# Patient Record
Sex: Female | Born: 2005 | Hispanic: Yes | Marital: Single | Smoking: Never smoker
Health system: Southern US, Community
[De-identification: ages and names within clinical notes are randomized; demographics above are authoritative.]

## PROBLEM LIST (undated history)

## (undated) DIAGNOSIS — E282 Polycystic ovarian syndrome: Secondary | ICD-10-CM

---
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
## 2024-03-28 ENCOUNTER — Other Ambulatory Visit: Payer: Self-pay

## 2024-03-28 ENCOUNTER — Emergency Department (HOSPITAL_COMMUNITY)

## 2024-03-28 ENCOUNTER — Encounter (HOSPITAL_COMMUNITY): Payer: Self-pay

## 2024-03-28 ENCOUNTER — Emergency Department (HOSPITAL_COMMUNITY)
Admission: EM | Admit: 2024-03-28 | Discharge: 2024-03-28 | Disposition: A | Discharge status: Home / Self Care | Attending: Emergency Medicine | Admitting: Emergency Medicine

## 2024-03-28 DIAGNOSIS — N946 Dysmenorrhea, unspecified: Secondary | ICD-10-CM | POA: Diagnosis not present

## 2024-03-28 DIAGNOSIS — R55 Syncope and collapse: Secondary | ICD-10-CM

## 2024-03-28 DIAGNOSIS — N939 Abnormal uterine and vaginal bleeding, unspecified: Secondary | ICD-10-CM | POA: Diagnosis present

## 2024-03-28 HISTORY — DX: Polycystic ovarian syndrome: E28.2

## 2024-03-28 LAB — COMPREHENSIVE METABOLIC PANEL WITH GFR
ALT: 22 U/L (ref 0–44)
AST: 20 U/L (ref 15–41)
Albumin: 3.9 g/dL (ref 3.5–5.0)
Alkaline Phosphatase: 70 U/L (ref 38–126)
Anion gap: 7 (ref 5–15)
BUN: 9 mg/dL (ref 6–20)
CO2: 24 mmol/L (ref 22–32)
Calcium: 9.3 mg/dL (ref 8.9–10.3)
Chloride: 104 mmol/L (ref 98–111)
Creatinine, Ser: 0.46 mg/dL (ref 0.44–1.00)
GFR, Estimated: >60 mL/min (ref >60)
Glucose, Bld: 93 mg/dL (ref 70–99)
Potassium: 3.8 mmol/L (ref 3.5–5.1)
Sodium: 135 mmol/L (ref 135–145)
Total Bilirubin: 0.5 mg/dL (ref 0.0–1.2)
Total Protein: 7.7 g/dL (ref 6.5–8.1)

## 2024-03-28 LAB — TYPE AND SCREEN
ABO/RH(D): O POS
Antibody Screen: NEGATIVE

## 2024-03-28 LAB — CBC WITH DIFFERENTIAL/PLATELET
Abs Immature Granulocytes: 0.03 10*3/uL (ref 0.00–0.07)
Basophils Absolute: 0 10*3/uL (ref 0.0–0.1)
Basophils Relative: 0 %
Eosinophils Absolute: 0.1 10*3/uL (ref 0.0–0.5)
Eosinophils Relative: 1 %
HCT: 41.7 % (ref 36.0–46.0)
Hemoglobin: 13.6 g/dL (ref 12.0–15.0)
Immature Granulocytes: 0 %
Lymphocytes Relative: 21 %
Lymphs Abs: 2.1 10*3/uL (ref 0.7–4.0)
MCH: 28.3 pg (ref 26.0–34.0)
MCHC: 32.6 g/dL (ref 30.0–36.0)
MCV: 86.7 fL (ref 80.0–100.0)
Monocytes Absolute: 0.6 10*3/uL (ref 0.1–1.0)
Monocytes Relative: 6 %
Neutro Abs: 7.2 10*3/uL (ref 1.7–7.7)
Neutrophils Relative %: 72 %
Platelets: 276 10*3/uL (ref 150–400)
RBC: 4.81 MIL/uL (ref 3.87–5.11)
RDW: 11.9 % (ref 11.5–15.5)
WBC: 10 10*3/uL (ref 4.0–10.5)
nRBC: 0 % (ref 0.0–0.2)

## 2024-03-28 LAB — LIPASE, BLOOD: Lipase: 25 U/L (ref 11–51)

## 2024-03-28 LAB — URINALYSIS, ROUTINE W REFLEX MICROSCOPIC
Bacteria, UA: NONE SEEN
Bilirubin Urine: NEGATIVE
Glucose, UA: NEGATIVE mg/dL
Ketones, ur: NEGATIVE mg/dL
Leukocytes,Ua: NEGATIVE
Nitrite: NEGATIVE
Protein, ur: 30 mg/dL — AB
RBC / HPF: >50 RBC/hpf (ref 0–5)
Specific Gravity, Urine: 1.009 (ref 1.005–1.030)
pH: 8 (ref 5.0–8.0)

## 2024-03-28 LAB — PREGNANCY, URINE: Preg Test, Ur: NEGATIVE

## 2024-03-28 MED ORDER — ONDANSETRON 4 MG PO TBDP
4.0000 mg | ORAL_TABLET | Freq: Once | ORAL | Status: AC
  Administered 2024-03-28: 4 mg via ORAL
  Filled 2024-03-28: qty 1

## 2024-03-28 MED ORDER — IBUPROFEN 600 MG PO TABS: 600.0000 mg | ORAL_TABLET | Freq: Three times a day (TID) | ORAL | 0 refills | Status: DC

## 2024-03-28 MED ORDER — IBUPROFEN 800 MG PO TABS
800.0000 mg | ORAL_TABLET | Freq: Once | ORAL | Status: AC
  Administered 2024-03-28: 800 mg via ORAL
  Filled 2024-03-28: qty 1

## 2024-03-28 MED ORDER — ACETAMINOPHEN 325 MG PO TABS
650.0000 mg | ORAL_TABLET | Freq: Once | ORAL | Status: AC
  Administered 2024-03-28: 650 mg via ORAL
  Filled 2024-03-28: qty 2

## 2024-03-28 NOTE — ED Provider Notes (Signed)
 [facility] Provider Note   CSN: [id number] Arrival date & time: 03/28/24  1005     History  Chief Complaint  Patient presents with   Vaginal Bleeding    Whitney Perez is a 18 y.o. female presenting for evaluation of syncope and heavy menstrual bleeding.  This is an 18 year old who has had a day delayed menarche and very infrequent menses, but has had 2 very heavy periods both last month and currently.  She endorses significant cramping despite trial of Pamperin which only gives her minimal relief.  [facility].  She is currently describing having to change her pad approximately every 2 hours since last night.  She also had an episode of syncope this morning.  She was in her home, states she had been standing for more than just a few minutes when she became lightheaded, she sat on the toilet as she was in the bathroom but had a full syncope despite sitting down.  She hit her head on the floor and has a small contusion on her forehead.  She did have some mild nausea after the event.  She does describe other episodes of feeling lightheaded but without full syncope.  These episodes tend to occur when her cramping is elevated.  The history is provided by the patient.       Home Medications Prior to Admission medications   Medication Sig Start Date End Date Taking? Authorizing Provider  ibuprofen (ADVIL) 600 MG tablet Take 1 tablet (600 mg total) by mouth 3 (three) times daily. 03/28/24  Yes Brandilyn Nanninga, PA-C  norethindrone  (MICRONOR ) 0.35 MG tablet Take 1 tablet (0.35 mg total) by mouth daily. 11/23/23 02/21/24  Ozan, Jennifer, DO      Allergies    Banana    Review of Systems   Review of Systems  Constitutional:  Negative for chills and fever.  HENT:  Negative for congestion and sore throat.   Eyes: Negative.   Respiratory:  Negative for chest tightness and shortness of breath.    Cardiovascular:  Negative for chest pain.  Gastrointestinal:  Negative for abdominal pain, nausea and vomiting.  Genitourinary:  Positive for vaginal bleeding.  Musculoskeletal:  Negative for arthralgias, joint swelling and neck pain.  Skin: Negative.  Negative for rash and wound.  Neurological:  Positive for syncope and headaches. Negative for dizziness, weakness, light-headedness and numbness.  Psychiatric/Behavioral: Negative.      Physical Exam Updated Vital Signs BP (!) 104/59 (BP Location: Right Arm)   Pulse 70   Temp 97.6 F (36.4 C) (Oral)   Resp 15   Ht 5\' 4"  (1.626 m)   Wt (!) 140.2 kg   LMP 03/28/2024 (Exact Date)   SpO2 98%   BMI 53.04 kg/m  Physical Exam Vitals and nursing note reviewed.  Constitutional:      Appearance: She is well-developed.  HENT:     Head: Normocephalic and atraumatic.  Eyes:     Conjunctiva/sclera: Conjunctivae normal.  Cardiovascular:     Rate and Rhythm: Normal rate and regular rhythm.     Heart sounds: Normal heart sounds.  Pulmonary:     Effort: Pulmonary effort is normal.     Breath sounds: Normal breath sounds. No wheezing.  Abdominal:     General: Bowel sounds are normal.     Palpations: Abdomen is soft.     Tenderness: There is no abdominal tenderness. There is  no guarding or rebound.  Musculoskeletal:        General: Normal range of motion.     Cervical back: Normal range of motion.  Skin:    General: Skin is warm and dry.  Neurological:     General: No focal deficit present.     Mental Status: She is alert and oriented to person, place, and time.     ED Results / Procedures / Treatments   Labs (all labs ordered are listed, but only abnormal results are displayed) Labs Reviewed  URINALYSIS, ROUTINE W REFLEX MICROSCOPIC - Abnormal; Notable for the following components:      Result Value   Hgb urine dipstick LARGE (*)    Protein, ur 30 (*)    All other components within normal limits  CBC WITH  DIFFERENTIAL/PLATELET  COMPREHENSIVE METABOLIC PANEL WITH GFR  LIPASE, BLOOD  PREGNANCY, URINE  TYPE AND SCREEN    EKG EKG Interpretation Date/Time:  Wednesday Mar 28 2024 17:52:22 EDT Ventricular Rate:  72 PR Interval:  155 QRS Duration:  99 QT Interval:  412 QTC Calculation: 451 R Axis:   -9  Text Interpretation: Sinus rhythm Low voltage, precordial leads Confirmed by Early Glisson ([id number]) on 03/28/2024 6:01:24 PM  Radiology CT Head Wo Contrast Result Date: 03/28/2024 CLINICAL DATA:  Head trauma, repeat vomiting (Age 37-64y), vaginal bleeding with blood clots and weakness. Dizziness and syncope resulting in patient hitting head. EXAM: CT HEAD WITHOUT CONTRAST TECHNIQUE: Contiguous axial images were obtained from the base of the skull through the vertex without intravenous contrast. RADIATION DOSE REDUCTION: This exam was performed according to the departmental dose-optimization program which includes automated exposure control, adjustment of the mA and/or kV according to patient size and/or use of iterative reconstruction technique. COMPARISON:  None available. FINDINGS: Brain: The ventricles appear age appropriate. No mass effect or midline shift. Gray-white differentiation is preserved without focal attenuation abnormality.No evidence of acute territorial infarction, extra-axial fluid collection, hemorrhage, or mass lesion. The basilar cisterns are patent without downward herniation. The cerebellar hemispheres and vermis are well formed without mass lesion or focal attenuation abnormality. Vascular: No hyperdense vessel. Skull: Normal. Negative for fracture or focal lesion. Sinuses/Orbits: The paranasal sinuses and mastoids are clear. The globes appear intact. No retrobulbar hematoma. Other: None. IMPRESSION: No acute intracranial abnormality, specifically, no acute hemorrhage, territorial infarction, or intracranial mass. Electronically Signed   By: Rance Burrows M.D.   On: 03/28/2024  16:43    Procedures Procedures    Medications Ordered in ED Medications  ondansetron  (ZOFRAN -ODT) disintegrating tablet 4 mg (4 mg Oral Given 03/28/24 1428)  acetaminophen (TYLENOL) tablet 650 mg (650 mg Oral Given 03/28/24 1517)  ibuprofen (ADVIL) tablet 800 mg (800 mg Oral Given 03/28/24 1805)    ED Course/ Medical Decision Making/ A&P                                 Medical Decision Making Patient presenting with an episode of syncope prior to arrival, hit her head, no postconcussion like symptoms although endorses some mild nausea.  Also is having dysmenorrhea since yesterday.  Her last menses finished 10 days ago.  [facility], currently being evaluated for probable PCOS.  Labs and imaging are reassuring as outlined below.  Orthostatics are negative.  EKG is normal.  Differential diagnosis including dehydration, pregnancy, anemia, vasovagal syncope, heart arrhythmia,'s seizure.  Amount and/or Complexity  of Data Reviewed Labs: ordered.    Details: Labs are normal including c-Met, lipase, CBC, urinalysis and urine pregnancy.  Her urine does reflect a large number of hemoglobin, however she is menstruating and this was a clean-catch specimen. Radiology: ordered.    Details: CT head is negative for acute intracranial injury from today's fall. ECG/medicine tests: ordered.    Details: Normal sinus rhythm with rate of 72.  Risk OTC drugs. Prescription drug management.           Final Clinical Impression(s) / ED Diagnoses Final diagnoses:  Dysmenorrhea  Syncope, unspecified syncope type    Rx / DC Orders ED Discharge Orders          Ordered    ibuprofen (ADVIL) 600 MG tablet  3 times daily        03/28/24 1734              Apryl Brymer, PA-C 03/28/24 1827    Teddi Favors, DO 03/29/24 0725

## 2024-03-28 NOTE — ED Notes (Signed)
 Pt transported to CT ?

## 2024-03-28 NOTE — Discharge Instructions (Addendum)
 Your lab tests,  exam, EKG and CT of your brain are all reassuring today you have not sustained any injuries from your episode of syncope today.  The reason for this is unclear but does not appear to be a dangerous condition.  For some people having increased pain can cause syncope, since this seems to occur when you are having increased menstrual cramping, perhaps it is related to this.  I am placing you on high dose ibuprofen to help you better with your menstrual cramping.  Make sure you are drinking plenty of fluids.  [facility] with [facility].  Use caution when you are standing from a seated position to avoid feeling lightheaded, change positions slowly.

## 2024-03-28 NOTE — ED Triage Notes (Signed)
 Pt arrived via POV c/o vaginal bleeding with blood clots. Pt reports this began 10 days ago and persisted, then stopped, but reoccurred again yesterday. Pt reports weakness and dizziness, and "passed out and hit my head." Pt endorses abdominal and lower back pain.

## 2024-04-03 ENCOUNTER — Other Ambulatory Visit (HOSPITAL_COMMUNITY)
Admission: RE | Admit: 2024-04-03 | Discharge: 2024-04-03 | Disposition: A | Discharge status: Home / Self Care | Source: Ambulatory Visit | Attending: Women's Health | Admitting: Women's Health

## 2024-04-03 ENCOUNTER — Ambulatory Visit: Admitting: Women's Health

## 2024-04-03 ENCOUNTER — Encounter: Payer: Self-pay | Admitting: Women's Health

## 2024-04-03 VITALS — BP 143/88 | HR 90 | Ht 64.0 in | Wt 326.2 lb

## 2024-04-03 DIAGNOSIS — E282 Polycystic ovarian syndrome: Secondary | ICD-10-CM | POA: Diagnosis not present

## 2024-04-03 DIAGNOSIS — N926 Irregular menstruation, unspecified: Secondary | ICD-10-CM

## 2024-04-03 MED ORDER — NORETHINDRONE 0.35 MG PO TABS: 1.0000 | ORAL_TABLET | Freq: Every day | ORAL | 3 refills | Status: AC

## 2024-04-03 NOTE — Patient Instructions (Signed)
 Call health department today and let them know your blood pressure was high

## 2024-04-03 NOTE — Progress Notes (Signed)
   GYN VISIT Patient name: Whitney Perez MRN [id number]  Date of birth: [birth year] Chief Complaint:   Follow-up (Seen in ER for first period in a long time. )  History of Present Illness:   Whitney Perez is a 18 y.o. G0P0000 Hispanic female being seen today for report of heavy prolonged period.  Saw Dr. Ozan in Jan, dx w/ PCOS, only has spotting x few days maybe once a year. Was rx'd micronor  (has migraines w/ aura), but never started them- forgot to pick them up from pharmacy. In April she bled x9d, stopped for about 9d then bled again x 10d, flow was heavy (changing pad q2hr), and had bad cramps. Went to ED 5/21 d/t passing out (period had stopped 10d prior). Hgb was 13.6. Is sexually active, doesn't want to conceive right now. Denies abnormal discharge, itching/odor/irritation.   Patient's last menstrual period was 03/28/2024 (exact date). Last pap <21yo. Results were: N/A      No data to display               No data to display           Review of Systems:   Pertinent items are noted in HPI Denies fever/chills, dizziness, headaches, visual disturbances, fatigue, shortness of breath, chest pain, abdominal pain, vomiting, abnormal vaginal discharge/itching/odor/irritation, problems with periods, bowel movements, urination, or intercourse unless otherwise stated above.  Pertinent History Reviewed:  Reviewed past medical,surgical, social, obstetrical and family history.  Reviewed problem list, medications and allergies. Physical Assessment:   Vitals:   04/03/24 0924 04/03/24 0934  BP: (!) 149/90 (!) 143/88  Pulse: 82 90  Weight: (!) 326 lb 3.2 oz (148 kg)   Height: 5\' 4"  (1.626 m)   Body mass index is 55.99 kg/m.       Physical Examination:   General appearance: alert, well appearing, and in no distress  Mental status: alert, oriented to person, place, and time  Skin: warm & dry   Cardiovascular: normal heart rate noted  Respiratory: normal respiratory effort, no  distress  Abdomen: soft, non-tender   Pelvic: VULVA: normal appearing vulva with no masses, tenderness or lesions, VAGINA: normal appearing vagina with normal color and discharge, no lesions, CERVIX: normal appearing cervix without discharge or lesions, UTERUS: unable to adequately palpate d/t body habitus, nontender, ADNEXA: unable to adequately palpate d/t body habitus, nontender Extremities: no edema   Chaperone: Lorean Rodes  No results found for this or any previous visit (from the past 24 hours).  Assessment & Plan:  1) PCOS w/ 2 recent prolonged/heavy periods> hgb 13.6 (5/21), not currently bleeding, CV swab today, resent micronor  (d/t migraines w/ aura), pick up today so doesn't forget, f/u  2) Elevated bp> to let PCP ([facility]) know/schedule appt  Meds:  Meds ordered this encounter  Medications   norethindrone  (MICRONOR ) 0.35 MG tablet    Sig: Take 1 tablet (0.35 mg total) by mouth daily.    Dispense:  90 tablet    Refill:  3    No orders of the defined types were placed in this encounter.   Return in about 3 months (around 07/04/2024) for GYN f/u, in person.  Ferd Householder CNM, [facility] 04/03/2024 9:54 AM

## 2024-04-04 ENCOUNTER — Ambulatory Visit: Payer: Self-pay | Admitting: Women's Health

## 2024-04-04 LAB — CERVICOVAGINAL ANCILLARY ONLY
Bacterial Vaginitis (gardnerella): NEGATIVE
Candida Glabrata: NEGATIVE
Candida Vaginitis: NEGATIVE
Chlamydia: NEGATIVE
Comment: NEGATIVE
Comment: NEGATIVE
Comment: NEGATIVE
Comment: NEGATIVE
Comment: NEGATIVE
Comment: NORMAL
Neisseria Gonorrhea: NEGATIVE
Trichomonas: NEGATIVE
